# Patient Record
Sex: Female | Born: 1990 | Race: Black or African American | Hispanic: No | Marital: Single | Smoking: Current every day smoker
Health system: Southern US, Community
[De-identification: ages and names within clinical notes are randomized; demographics above are authoritative.]

## PROBLEM LIST (undated history)

## (undated) DIAGNOSIS — N83209 Unspecified ovarian cyst, unspecified side: Secondary | ICD-10-CM

---

## 2016-08-06 ENCOUNTER — Emergency Department (HOSPITAL_COMMUNITY): Payer: Self-pay

## 2016-08-06 ENCOUNTER — Encounter (HOSPITAL_COMMUNITY): Payer: Self-pay

## 2016-08-06 ENCOUNTER — Emergency Department (HOSPITAL_COMMUNITY)
Admission: EM | Admit: 2016-08-06 | Discharge: 2016-08-06 | Disposition: A | Discharge status: Home / Self Care | Payer: Self-pay | Attending: Emergency Medicine | Admitting: Emergency Medicine

## 2016-08-06 DIAGNOSIS — R1012 Left upper quadrant pain: Secondary | ICD-10-CM | POA: Insufficient documentation

## 2016-08-06 DIAGNOSIS — R42 Dizziness and giddiness: Secondary | ICD-10-CM | POA: Insufficient documentation

## 2016-08-06 DIAGNOSIS — R51 Headache: Secondary | ICD-10-CM | POA: Insufficient documentation

## 2016-08-06 DIAGNOSIS — R0602 Shortness of breath: Secondary | ICD-10-CM | POA: Insufficient documentation

## 2016-08-06 DIAGNOSIS — R079 Chest pain, unspecified: Secondary | ICD-10-CM | POA: Insufficient documentation

## 2016-08-06 DIAGNOSIS — F17210 Nicotine dependence, cigarettes, uncomplicated: Secondary | ICD-10-CM | POA: Insufficient documentation

## 2016-08-06 DIAGNOSIS — R110 Nausea: Secondary | ICD-10-CM | POA: Insufficient documentation

## 2016-08-06 HISTORY — DX: Unspecified ovarian cyst, unspecified side: N83.209

## 2016-08-06 LAB — CBC WITH DIFFERENTIAL/PLATELET
BASOS ABS: 0 10*3/uL (ref 0.0–0.1)
Basophils Relative: 0 %
Eosinophils Absolute: 0.3 10*3/uL (ref 0.0–0.7)
Eosinophils Relative: 2 %
HEMATOCRIT: 40.2 % (ref 36.0–46.0)
HEMOGLOBIN: 13.6 g/dL (ref 12.0–15.0)
Lymphocytes Relative: 29 %
Lymphs Abs: 3.5 10*3/uL (ref 0.7–4.0)
MCH: 28.2 pg (ref 26.0–34.0)
MCHC: 33.8 g/dL (ref 30.0–36.0)
MCV: 83.2 fL (ref 78.0–100.0)
Monocytes Absolute: 0.8 10*3/uL (ref 0.1–1.0)
Monocytes Relative: 6 %
NEUTROS ABS: 7.7 10*3/uL (ref 1.7–7.7)
NEUTROS PCT: 63 %
Platelets: 263 10*3/uL (ref 150–400)
RBC: 4.83 MIL/uL (ref 3.87–5.11)
RDW: 13.6 % (ref 11.5–15.5)
WBC: 12.3 10*3/uL — AB (ref 4.0–10.5)

## 2016-08-06 LAB — COMPREHENSIVE METABOLIC PANEL
ALBUMIN: 4.4 g/dL (ref 3.5–5.0)
ALT: 14 U/L (ref 14–54)
AST: 18 U/L (ref 15–41)
Alkaline Phosphatase: 79 U/L (ref 38–126)
Anion gap: 7 (ref 5–15)
BILIRUBIN TOTAL: 0.3 mg/dL (ref 0.3–1.2)
BUN: 19 mg/dL (ref 6–20)
CO2: 27 mmol/L (ref 22–32)
Calcium: 9.1 mg/dL (ref 8.9–10.3)
Chloride: 103 mmol/L (ref 101–111)
Creatinine, Ser: 0.66 mg/dL (ref 0.44–1.00)
GFR calc Af Amer: >60 mL/min (ref >60)
GFR calc non Af Amer: >60 mL/min (ref >60)
GLUCOSE: 93 mg/dL (ref 65–99)
POTASSIUM: 3.7 mmol/L (ref 3.5–5.1)
Sodium: 137 mmol/L (ref 135–145)
Total Protein: 8 g/dL (ref 6.5–8.1)

## 2016-08-06 LAB — URINALYSIS, ROUTINE W REFLEX MICROSCOPIC
Bilirubin Urine: NEGATIVE
Glucose, UA: NEGATIVE mg/dL
Hgb urine dipstick: NEGATIVE
Ketones, ur: NEGATIVE mg/dL
Leukocytes, UA: NEGATIVE
Nitrite: NEGATIVE
Protein, ur: NEGATIVE mg/dL
Specific Gravity, Urine: 1.026 (ref 1.005–1.030)
pH: 6 (ref 5.0–8.0)

## 2016-08-06 LAB — PREGNANCY, URINE: PREG TEST UR: NEGATIVE

## 2016-08-06 LAB — TROPONIN I: Troponin I: <0.03 ng/mL (ref <0.03)

## 2016-08-06 LAB — D-DIMER, QUANTITATIVE: D-Dimer, Quant: 0.36 ug/mL-FEU (ref 0.00–0.50)

## 2016-08-06 MED ORDER — IBUPROFEN 400 MG PO TABS
400.0000 mg | ORAL_TABLET | Freq: Once | ORAL | Status: AC
  Administered 2016-08-06: 400 mg via ORAL
  Filled 2016-08-06: qty 1

## 2016-08-06 MED ORDER — NAPROXEN 500 MG PO TABS: 500.0000 mg | ORAL_TABLET | Freq: Two times a day (BID) | ORAL | 0 refills | Status: AC

## 2016-08-06 NOTE — ED Triage Notes (Signed)
Pt reports dizziness has resolved after she took 2 aleve for a migraine. Pt reports LUQ pain that started last night also reports shortness of breath.

## 2016-08-06 NOTE — Discharge Instructions (Signed)
You were seen today for chest pain. Your workup is reassuring. Take naproxen twice daily if pain persists. Follow-up with her primary physician. If you have any new or worsening symptoms she should be reevaluated.

## 2016-08-06 NOTE — ED Provider Notes (Signed)
[facility]-EMERGENCY DEPT Provider Note   CSN: [id number] Arrival date & time: 08/06/16  0021   By signing my name below, I, Teresa Marks, attest that this documentation has been prepared under the direction and in the presence of Shon Baton, MD. Electronically signed, Teresa Marks, ED Scribe. 08/06/16. 12:44 AM.   History   Chief Complaint Chief Complaint  Patient presents with  . Abdominal Pain   The history is provided by the patient, medical records and a relative. No language interpreter was used.    HPI Comments: Teresa Marks is a 26 y.o. female who presents to the Emergency Department complaining of intermittent SOB x~8 hours. She notes associated sharp, 7/10 Chest/LUQ pain exacerbated with deep breathing onset the night of 08/04/2016; since onset of Cc, pt further notes associated HA that has subsided, dizziness that has subsided and intermittent nausea that has subsided. Triage notes pt's HA resolved after she took 2 aleve. Pt on birth control and unsure when her LNMP was. Pt smokes cigarettes and drinks occasionally. She notes she is not on any prescribed medications at home. Hx of ovarian cysts reported in the past year. Pt denies cough, vomiting, diarrhea and fever.  Is one birth control.  Past Medical History:  Diagnosis Date  . Ovarian cyst     There are no active problems to display for this patient.   History reviewed. No pertinent surgical history.  OB History    No data available       Home Medications    Prior to Admission medications   Medication Sig Start Date End Date Taking? Authorizing Provider  naproxen (NAPROSYN) 500 MG tablet Take 1 tablet (500 mg total) by mouth 2 (two) times daily. 08/06/16   Shon Baton, MD    Family History No family history on file.  Social History Social History  Substance Use Topics  . Smoking status: Current Every Day Smoker    Packs/day: 0.50    Types: Cigarettes  . Smokeless tobacco: Never Used  .  Alcohol use Not on file     Comment: occasional liquor     Allergies   Amoxicillin   Review of Systems Review of Systems  Constitutional: Negative for fever.  HENT: Negative for congestion.   Respiratory: Positive for shortness of breath. Negative for cough.   Cardiovascular: Positive for chest pain.  Gastrointestinal: Positive for nausea. Negative for abdominal pain, diarrhea and vomiting.  Neurological: Positive for dizziness and headaches.  All other systems reviewed and are negative.    Physical Exam Updated Vital Signs BP 120/82 (BP Location: Left Arm)   Pulse 99   Resp 18   Ht 4\' 9"  (1.448 m)   Wt 110 lb (49.9 kg)   SpO2 98%   BMI 23.80 kg/m   Physical Exam  Constitutional: She is oriented to person, place, and time. She appears well-developed and well-nourished.  HENT:  Head: Normocephalic and atraumatic.  Cardiovascular: Normal rate, regular rhythm and normal heart sounds.   Pulmonary/Chest: Effort normal and breath sounds normal. No respiratory distress. She has no wheezes. She exhibits no tenderness.  Abdominal: Soft. Bowel sounds are normal. She exhibits no mass. There is no tenderness. There is no guarding.  Neurological: She is alert and oriented to person, place, and time.  Skin: Skin is warm and dry.  Psychiatric: She has a normal mood and affect.  Nursing note and vitals reviewed.    ED Treatments / Results  DIAGNOSTIC STUDIES: Oxygen Saturation is 98% on  RA, normal by my interpretation.    COORDINATION OF CARE: 12:38 AM Discussed treatment plan with pt at bedside and pt agreed to plan. Will order imaging, labs and medications and then reassess.  Labs (all labs ordered are listed, but only abnormal results are displayed) Labs Reviewed  URINALYSIS, ROUTINE W REFLEX MICROSCOPIC - Abnormal; Notable for the following:       Result Value   Bacteria, UA RARE (*)    All other components within normal limits  CBC WITH DIFFERENTIAL/PLATELET -  Abnormal; Notable for the following:    WBC 12.3 (*)    All other components within normal limits  PREGNANCY, URINE  COMPREHENSIVE METABOLIC PANEL  TROPONIN I  D-DIMER, QUANTITATIVE (NOT AT [facility])    EKG  EKG Interpretation  Date/Time:  Saturday August 06 2016 01:28:39 EST Ventricular Rate:  77 PR Interval:    QRS Duration: 78 QT Interval:  358 QTC Calculation: 406 R Axis:   59 Text Interpretation:  Sinus rhythm Atrial premature complex Confirmed by HORTON  MD, COURTNEY ([id number]) on 08/06/2016 1:57:33 AM       Radiology Dg Chest 2 View  Result Date: 08/06/2016 CLINICAL DATA:  Left-sided chest pain. EXAM: CHEST  2 VIEW COMPARISON:  None. FINDINGS: The cardiomediastinal contours are normal. The lungs are clear. Pulmonary vasculature is normal. No consolidation, pleural effusion, or pneumothorax. No acute osseous abnormalities are seen. IMPRESSION: No acute abnormality. Electronically Signed   By: Rubye OaksMelanie  Ehinger M.D.   On: 08/06/2016 01:49    Procedures Procedures (including critical care time)  Medications Ordered in ED Medications  ibuprofen (ADVIL,MOTRIN) tablet 400 mg (400 mg Oral Given 08/06/16 0121)     Initial Impression / Assessment and Plan / ED Course  I have reviewed the triage vital signs and the nursing notes.  Pertinent labs & imaging results that were available during my care of the patient were reviewed by me and considered in my medical decision making (see chart for details).     Shunt presents with left chest and left upper quadrant pain. Nontoxic on exam. Vital signs reassuring. Symptoms seem most related to her chest and chest wall. No significant abdominal tenderness. No reproducible chest wall tenderness. She is on birth control which puts her at slightly increased risk for PE. No recent infectious symptoms. EKG, troponin, d-dimer reassuring. Basic lab work including liver function testing is also reassuring. Patient was given ibuprofen and is resting  comfortably on recheck. Unclear etiology of symptoms on however, feel that patient has been medically screened and there is no evidence of an acute emergent process.  After history, exam, and medical workup I feel the patient has been appropriately medically screened and is safe for discharge home. Pertinent diagnoses were discussed with the patient. Patient was given return precautions.  I personally performed the services described in this documentation, which was scribed in my presence. The recorded information has been reviewed and is accurate.   Final Clinical Impressions(s) / ED Diagnoses   Final diagnoses:  Chest pain, unspecified type    New Prescriptions New Prescriptions   NAPROXEN (NAPROSYN) 500 MG TABLET    Take 1 tablet (500 mg total) by mouth 2 (two) times daily.     Shon Batonourtney F Horton, MD 08/06/16 [phone]

## 2016-08-06 NOTE — ED Notes (Signed)
Pt states she can not urinate at this time but will let me know when she does.

## 2017-10-04 ENCOUNTER — Other Ambulatory Visit: Payer: Self-pay

## 2017-10-04 ENCOUNTER — Emergency Department (HOSPITAL_COMMUNITY)
Admission: EM | Admit: 2017-10-04 | Discharge: 2017-10-04 | Disposition: A | Discharge status: Home / Self Care | Payer: Self-pay | Attending: Emergency Medicine | Admitting: Emergency Medicine

## 2017-10-04 DIAGNOSIS — N3001 Acute cystitis with hematuria: Secondary | ICD-10-CM | POA: Insufficient documentation

## 2017-10-04 DIAGNOSIS — F17210 Nicotine dependence, cigarettes, uncomplicated: Secondary | ICD-10-CM | POA: Insufficient documentation

## 2017-10-04 LAB — URINALYSIS, ROUTINE W REFLEX MICROSCOPIC
Bilirubin Urine: NEGATIVE
Glucose, UA: NEGATIVE mg/dL
Ketones, ur: NEGATIVE mg/dL
NITRITE: NEGATIVE
Protein, ur: NEGATIVE mg/dL
SPECIFIC GRAVITY, URINE: 1.009 (ref 1.005–1.030)
WBC, UA: >50 WBC/hpf — ABNORMAL HIGH (ref 0–5)
pH: 6 (ref 5.0–8.0)

## 2017-10-04 LAB — POC URINE PREG, ED: PREG TEST UR: NEGATIVE

## 2017-10-04 MED ORDER — NITROFURANTOIN MONOHYD MACRO 100 MG PO CAPS: 100.0000 mg | ORAL_CAPSULE | Freq: Two times a day (BID) | ORAL | 0 refills | Status: AC

## 2017-10-04 MED ORDER — NITROFURANTOIN MONOHYD MACRO 100 MG PO CAPS
100.0000 mg | ORAL_CAPSULE | Freq: Once | ORAL | Status: AC
  Administered 2017-10-04: 100 mg via ORAL
  Filled 2017-10-04: qty 1

## 2017-10-04 NOTE — ED Notes (Signed)
See provider assessment 

## 2017-10-04 NOTE — ED Provider Notes (Signed)
[facility] EMERGENCY DEPARTMENT Provider Note   CSN: [id number] Arrival date & time: 10/04/17  1844     History   Chief Complaint Chief Complaint  Patient presents with  . Dysuria    HPI Teresa Marks is a 27 y.o. female.  HPI   27 year old female presents today with complaints of dysuria.  Patient reports symptoms have been ongoing for approximately 10 days when she notes some blood in her urine.  She denies any abdominal pain nausea vomiting or fever.  She denies any vaginal discharge or bleeding.  Patient reports she does not frequently get urinary tract infections.  Past Medical History:  Diagnosis Date  . Ovarian cyst     There are no active problems to display for this patient.   No past surgical history on file.   OB History   None      Home Medications    Prior to Admission medications   Medication Sig Start Date End Date Taking? Authorizing Provider  naproxen (NAPROSYN) 500 MG tablet Take 1 tablet (500 mg total) by mouth 2 (two) times daily. 08/06/16   Horton, Mayer Masker, MD  nitrofurantoin, macrocrystal-monohydrate, (MACROBID) 100 MG capsule Take 1 capsule (100 mg total) by mouth 2 (two) times daily. 10/04/17   Eyvonne Mechanic, PA-C    Family History No family history on file.  Social History Social History   Tobacco Use  . Smoking status: Current Every Day Smoker    Packs/day: 0.50    Types: Cigarettes  . Smokeless tobacco: Never Used  Substance Use Topics  . Alcohol use: Not on file    Comment: occasional liquor  . Drug use: No     Allergies   Amoxicillin   Review of Systems Review of Systems  All other systems reviewed and are negative.    Physical Exam Updated Vital Signs BP 101/69 (BP Location: Right Arm)   Pulse 69   Temp 98.7 F (37.1 C) (Oral)   Resp 16   Ht  (1.448 m)   Wt 52.6 kg (116 lb)   LMP 09/22/2017   SpO2 100%   BMI 25.10 kg/m   Physical Exam  Constitutional: She is oriented to  person, place, and time. She appears well-developed and well-nourished.  HENT:  Head: Normocephalic and atraumatic.  Eyes: Pupils are equal, round, and reactive to light. Conjunctivae are normal. Right eye exhibits no discharge. Left eye exhibits no discharge. No scleral icterus.  Neck: Normal range of motion. No JVD present. No tracheal deviation present.  Pulmonary/Chest: Effort normal. No stridor.  Abdominal: Soft. She exhibits no distension. There is no tenderness. There is no rebound and no guarding. No hernia.  No CVA tenderness   Neurological: She is alert and oriented to person, place, and time. Coordination normal.  Psychiatric: She has a normal mood and affect. Her behavior is normal. Judgment and thought content normal.  Nursing note and vitals reviewed.    ED Treatments / Results  Labs (all labs ordered are listed, but only abnormal results are displayed) Labs Reviewed  URINALYSIS, ROUTINE W REFLEX MICROSCOPIC - Abnormal; Notable for the following components:      Result Value   APPearance HAZY (*)    Hgb urine dipstick MODERATE (*)    Leukocytes, UA LARGE (*)    WBC, UA >50 (*)    Bacteria, UA RARE (*)    All other components within normal limits  POC URINE PREG, ED    EKG None  Radiology No results found.  Procedures Procedures (including critical care time)  Medications Ordered in ED Medications  nitrofurantoin (macrocrystal-monohydrate) (MACROBID) capsule 100 mg (has no administration in time range)     Initial Impression / Assessment and Plan / ED Course  I have reviewed the triage vital signs and the nursing notes.  Pertinent labs & imaging results that were available during my care of the patient were reviewed by me and considered in my medical decision making (see chart for details).     Patient's presentation is most consistent with uncomplicated urinary tract infection.  She has no signs of pyelonephritis, no signs of systemic illness.  No  recent antibiotics or frequent urinary tract infections.  She will be started on Macrobid encouraged to return if symptoms persist despite antibiotic therapy or return if any new or worsening signs or symptoms present.  Patient verbalized understanding and agreement to today's plan had no further questions or concerns at the time of discharge Final Clinical Impressions(s) / ED Diagnoses   Final diagnoses:  Acute cystitis with hematuria    ED Discharge Orders        Ordered    nitrofurantoin, macrocrystal-monohydrate, (MACROBID) 100 MG capsule  2 times daily     10/04/17 2110       Eyvonne Mechanic, PA-C 10/04/17 2143    Nira Conn, MD 10/05/17 1334

## 2017-10-04 NOTE — ED Triage Notes (Signed)
Patient c/o painful urination with mild blood after wiping x 10 days.

## 2017-10-04 NOTE — Discharge Instructions (Addendum)
Please read attached information. If you experience any new or worsening signs or symptoms please return to the emergency room for evaluation. Please follow-up with your primary care provider or specialist as discussed. Please use medication prescribed only as directed and discontinue taking if you have any concerning signs or symptoms.   °

## 2017-10-04 NOTE — ED Notes (Signed)
Pt stable and ambulatory for discharge, states understanding follow up does not want to wait 15 minutes after given oral antibiotics. Instructed to return if any signs of allergic reaction

## 2018-01-06 IMAGING — DX DG CHEST 2V
2 series · 2 of 2 positions shown · non-contrast
Comparison: None.

CLINICAL DATA: Left-sided chest pain.

EXAM:
CHEST  2 VIEW

[chest pa]
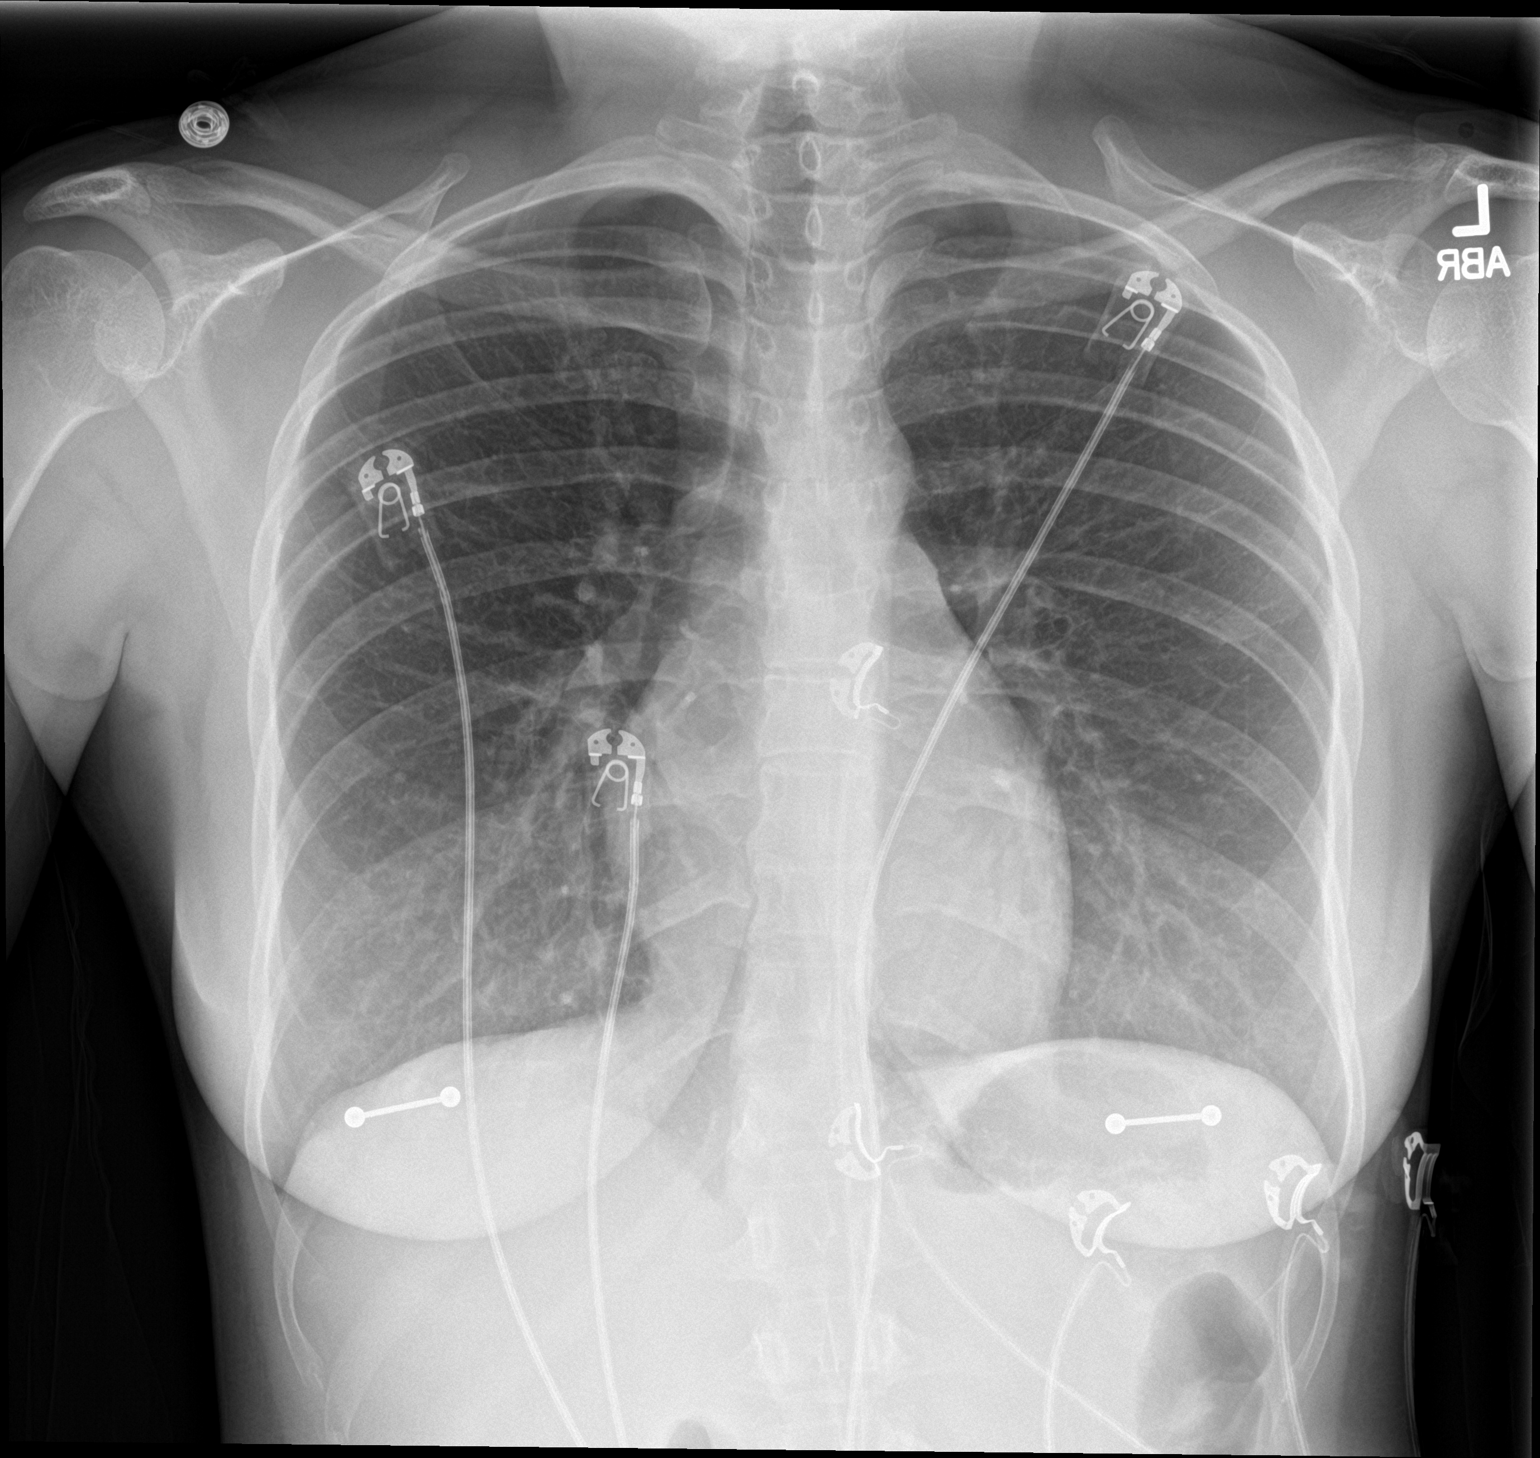

[chest lat]
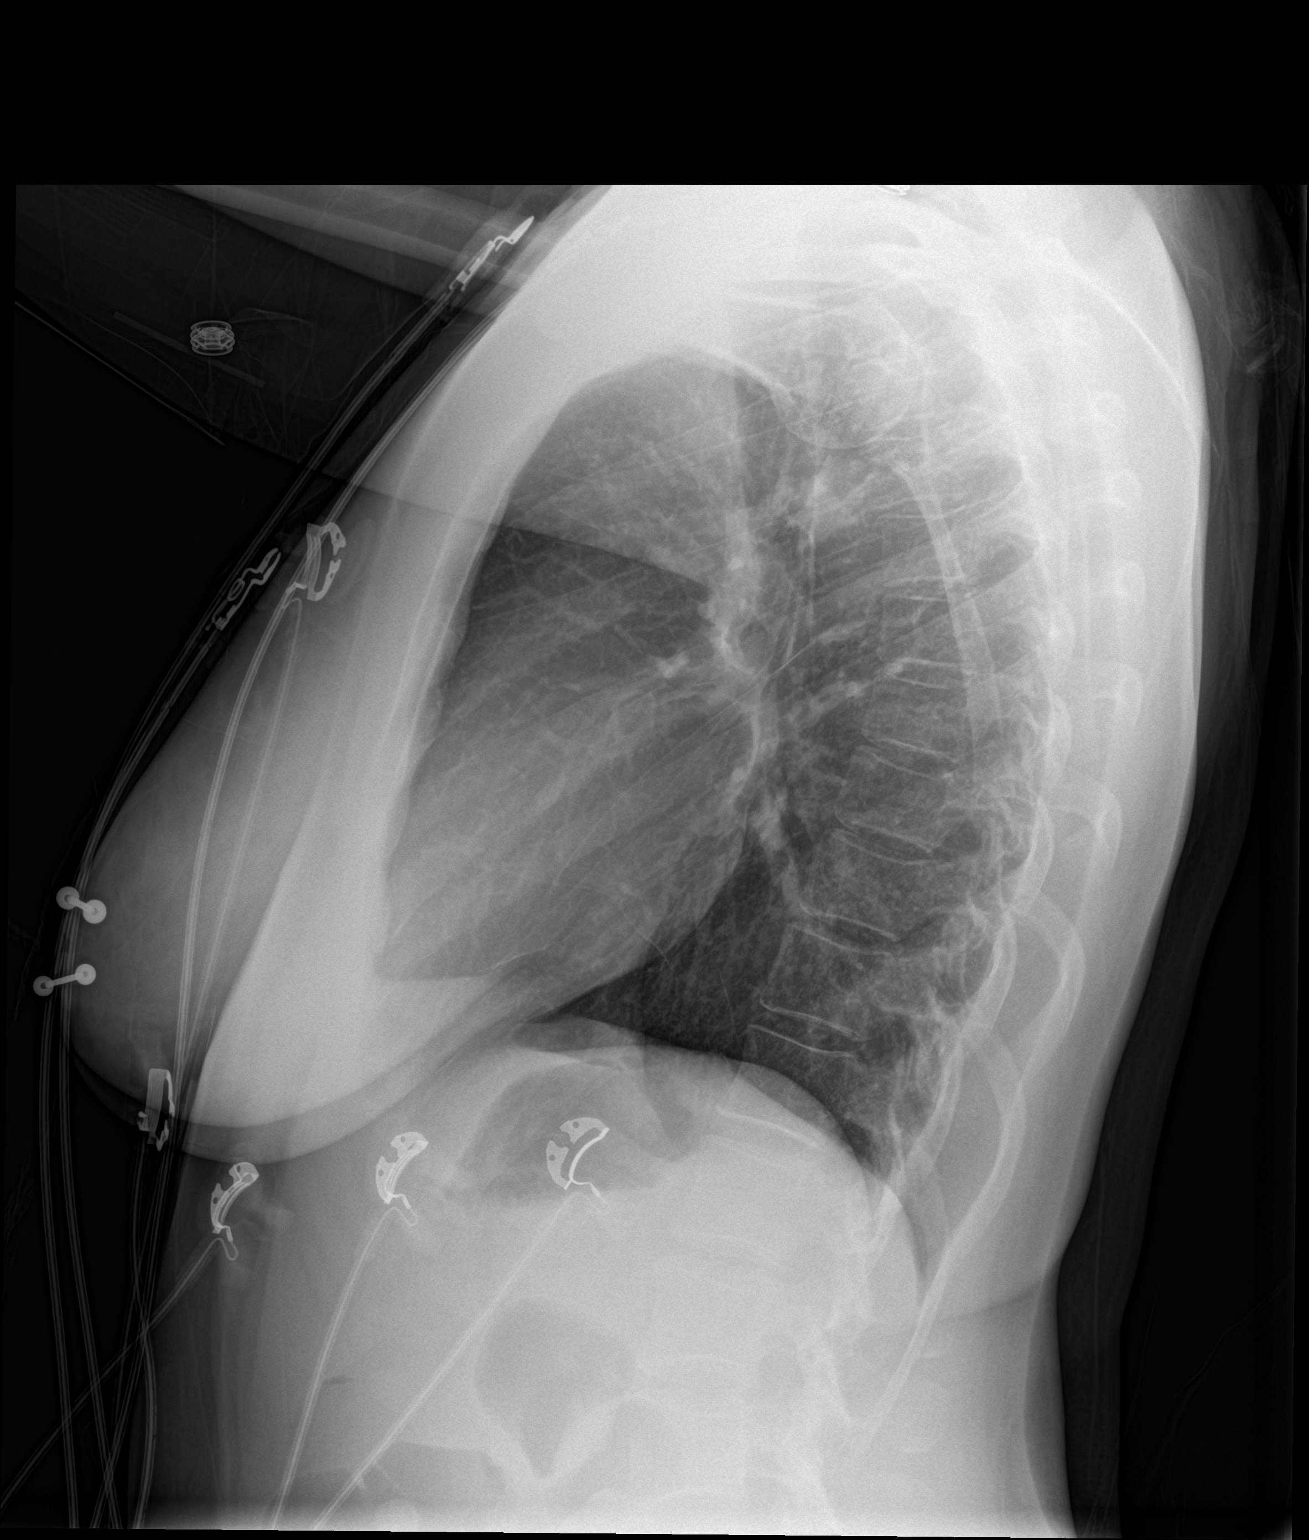

[2 of 2 positions shown; findings below may reference images not displayed]

FINDINGS: The cardiomediastinal contours are normal. The lungs are clear.
Pulmonary vasculature is normal. No consolidation, pleural effusion,
or pneumothorax. No acute osseous abnormalities are seen.
IMPRESSION: No acute abnormality.
